# Patient Record
Sex: Female | Born: 1990 | Hispanic: No | Marital: Single | Smoking: Never smoker
Health system: Southern US, Community
[De-identification: ages and names within clinical notes are randomized; demographics above are authoritative.]

## PROBLEM LIST (undated history)

## (undated) DIAGNOSIS — J45909 Unspecified asthma, uncomplicated: Secondary | ICD-10-CM

## (undated) HISTORY — DX: Unspecified asthma, uncomplicated: J45.909

## (undated) HISTORY — PX: WISDOM TOOTH EXTRACTION: SHX21

---

## 2012-03-28 ENCOUNTER — Ambulatory Visit: Payer: Self-pay | Admitting: Family Medicine

## 2012-03-28 VITALS — BP 101/64 | HR 106 | Temp 99.1°F | Resp 16 | Ht 63.5 in | Wt 114.8 lb

## 2012-03-28 DIAGNOSIS — J029 Acute pharyngitis, unspecified: Secondary | ICD-10-CM

## 2012-03-28 LAB — POCT RAPID STREP A (OFFICE): Rapid Strep A Screen: NEGATIVE

## 2012-03-28 MED ORDER — AMOXICILLIN 875 MG PO TABS: 875.0000 mg | ORAL_TABLET | Freq: Two times a day (BID) | ORAL | Status: AC

## 2012-03-28 NOTE — Patient Instructions (Signed)

## 2012-03-28 NOTE — Progress Notes (Signed)
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  She has two days of sore throat, worsening, and associated with some difficulty breathing today.  No GI sx  O:  NAD Throat intensely red in posterior pharynx TM's normal Neck: supple without adenopathy Chest:  No wheezing Heart:  Reg, no murmur or gallop No rash Results for orders placed in visit on 03/28/12  POCT RAPID STREP A (OFFICE)      Component Value Range   Rapid Strep A Screen Negative  Negative    A:  Acute pharyngitis  P:  Amoxicillin 875 bid x 7

## 2013-01-23 ENCOUNTER — Ambulatory Visit: Payer: BC Managed Care – PPO | Admitting: Rehabilitative and Restorative Service Providers"

## 2013-10-03 ENCOUNTER — Ambulatory Visit: Payer: Federal, State, Local not specified - PPO

## 2013-10-03 ENCOUNTER — Ambulatory Visit (INDEPENDENT_AMBULATORY_CARE_PROVIDER_SITE_OTHER): Payer: Federal, State, Local not specified - PPO | Admitting: Family Medicine

## 2013-10-03 VITALS — BP 112/72 | HR 98 | Temp 98.6°F | Resp 18 | Ht 64.0 in | Wt 120.0 lb

## 2013-10-03 DIAGNOSIS — R0602 Shortness of breath: Secondary | ICD-10-CM

## 2013-10-03 DIAGNOSIS — R0789 Other chest pain: Secondary | ICD-10-CM

## 2013-10-03 LAB — POCT CBC
Granulocyte percent: 62.5 %G (ref 37–80)
Hemoglobin: 13.4 g/dL (ref 12.2–16.2)
MCV: 89.9 fL (ref 80–97)
MID (cbc): 0.3 (ref 0–0.9)
Platelet Count, POC: 200 10*3/uL (ref 142–424)
RBC: 4.76 M/uL (ref 4.04–5.48)
WBC: 5.6 10*3/uL (ref 4.6–10.2)

## 2013-10-03 MED ORDER — ALBUTEROL SULFATE HFA 108 (90 BASE) MCG/ACT IN AERS: 2.0000 | INHALATION_SPRAY | RESPIRATORY_TRACT | Status: AC | PRN

## 2013-10-03 MED ORDER — IPRATROPIUM BROMIDE 0.02 % IN SOLN: 0.5000 mg | Freq: Once | RESPIRATORY_TRACT | Status: AC

## 2013-10-03 MED ORDER — ALBUTEROL SULFATE (2.5 MG/3ML) 0.083% IN NEBU: 2.5000 mg | INHALATION_SOLUTION | Freq: Once | RESPIRATORY_TRACT | Status: AC

## 2013-10-03 MED ORDER — GUAIFENESIN ER 1200 MG PO TB12: 1.0000 | ORAL_TABLET | Freq: Two times a day (BID) | ORAL | Status: AC | PRN

## 2013-10-03 NOTE — Progress Notes (Signed)
Subjective:    Patient ID: Hannah Joyce, female    DOB: 13-Mar-1991, 22 y.o.   MRN: [id number]  HPI  This 22 y.o. female presents for evaluation of SOB. Felt fine yesterday and upon going to bed last night. Awoke 4-5 times during the night gasping, couldn't catch her breath. "Like a 400-pound man was sitting on my chest." Still feels a heaviness, and now feels a scratchiness in the throat. Had some dizziness earlier today, and some congestion in her throat. No recent travel or prolonged inactivity. No chest PAIN. No abdominal pain. No alleviating/aggravating factors. No coughing, HA, nausea, vomiting, diarrhea, urinary urgency or frequency, myalgias or arthralgias.    Active Ambulatory Problems    Diagnosis Date Noted  . No Active Ambulatory Problems   Resolved Ambulatory Problems    Diagnosis Date Noted  . No Resolved Ambulatory Problems   Past Medical History  Diagnosis Date  . Asthma     Past Surgical History  Procedure Laterality Date  . Wisdom tooth extraction      No Known Allergies  Prior to Admission medications   Medication Sig Start Date End Date Taking? Authorizing Provider  Norethin-Eth Estradiol-Fe (GENERESS FE PO) Take by mouth.   Yes Historical Provider, MD    History   Social History  . Marital Status: Single    Spouse Name: n/a    Number of Children: 0  . Years of Education: N/A   Occupational History  . Student     [employer]   Social History Main Topics  . Smoking status: Never Smoker   . Smokeless tobacco: Never Used  . Alcohol Use: No  . Drug Use: No  . Sexual Activity: None   Other Topics Concern  . None   Social History Narrative   Lives on campus at [employer] with a roommate.  Her family lives in [place], [place].     family history includes Diabetes in her maternal grandfather; Hypertension in her father and paternal grandmother. indicated that her mother is alive. She indicated that her father is alive. She  indicated that her brother is alive. She indicated that her maternal grandfather is alive. She indicated that her paternal grandmother is alive.    Review of Systems     Objective:   Physical Exam Blood pressure 112/72, pulse 98, temperature 98.6 F (37 C), temperature source Oral, resp. rate 18, height 5\' 4"  (1.626 m), weight 120 lb (54.432 kg), last menstrual period 09/05/2013, SpO2 100.00%. Body mass index is 20.59 kg/(m^2). Well-developed, well nourished BF who is awake, alert and oriented, in NAD. HEENT: /AT, PERRL, EOMI.  Sclera and conjunctiva are clear.  Funduscopic exam is normal bilaterally. EAC are notable for large cerumen bilaterally, L>R, RIGHT TM normal in appearance. LEFT TM obscured by cerumen. Nasal mucosa is pink and moist. OP is clear. Neck: supple, non-tender, no lymphadenopathy, thyromegaly. Heart: RRR, no murmur Lungs: normal effort, CTA Abdomen: normo-active bowel sounds, supple, no mass or organomegaly. Mild suprapubic tenderness on exam. Extremities: no cyanosis, clubbing or edema. Skin: warm and dry without rash. Psychologic: good mood and appropriate affect, normal speech and behavior.   CXR: [facility] reading (PRIMARY) by  Dr. Patsy Lager.  Normal chest.  No evidence of infiltrate or pneumothorax.  EKG: NSR.  No ischemia or dysrhythmias. Reviewed with Dr. Patsy Lager.  Results for orders placed in visit on 10/03/13  POCT CBC      Result Value Range   WBC 5.6  4.6 - 10.2 K/uL  Lymph, poc 1.8  0.6 - 3.4   POC LYMPH PERCENT 32.8  10 - 50 %L   MID (cbc) 0.3  0 - 0.9   POC MID % 4.7  0 - 12 %M   POC Granulocyte 3.5  2 - 6.9   Granulocyte percent 62.5  37 - 80 %G   RBC 4.76  4.04 - 5.48 M/uL   Hemoglobin 13.4  12.2 - 16.2 g/dL   HCT, POC 16.1  09.6 - 47.9 %   MCV 89.9  80 - 97 fL   MCH, POC 28.2  27 - 31.2 pg   MCHC 31.3 (*) 31.8 - 35.4 g/dL   RDW, POC 04.5     Platelet Count, POC 200  142 - 424 K/uL   MPV 10.1  0 - 99.8 fL   Albuterol + Atrovent neb  without change in symptoms.     Assessment & Plan:  SOB (shortness of breath) - Plan: POCT CBC, DG Chest 2 View, D-dimer, quantitative, albuterol (PROVENTIL) (2.5 MG/3ML) 0.083% nebulizer solution 2.5 mg, ipratropium (ATROVENT) nebulizer solution 0.5 mg  Chest tightness or pressure - Plan: EKG 12-Lead, albuterol (PROVENTIL HFA;VENTOLIN HFA) 108 (90 BASE) MCG/ACT inhaler, Guaifenesin (MUCINEX MAXIMUM STRENGTH) 1200 MG TB12  Etiology not identified.  Await D-dimer.  If positive, plan CT chest.  If negative, rest, hydrate and await resolution or development of new symptoms to help identify cause.  Fernande Bras, PA-C Physician Assistant-Certified [facility]

## 2013-10-03 NOTE — Patient Instructions (Signed)
Use the albuterol inhaler if you become short of breath. If it is ineffective, go to the emergency department or call 911. Use the mucinex to thin the mucous you feel in the throat.  I will contact you with the results of the d-dimer test as soon as it is available. If you haven't heard from me by 8 pm this evening, please contact me.

## 2014-06-20 IMAGING — CR DG CHEST 2V
2 series · 2 of 2 positions shown · non-contrast
Comparison: None.

CLINICAL DATA: Shortness of breath, chest pressure

EXAM:
CHEST  2 VIEW

[PA]
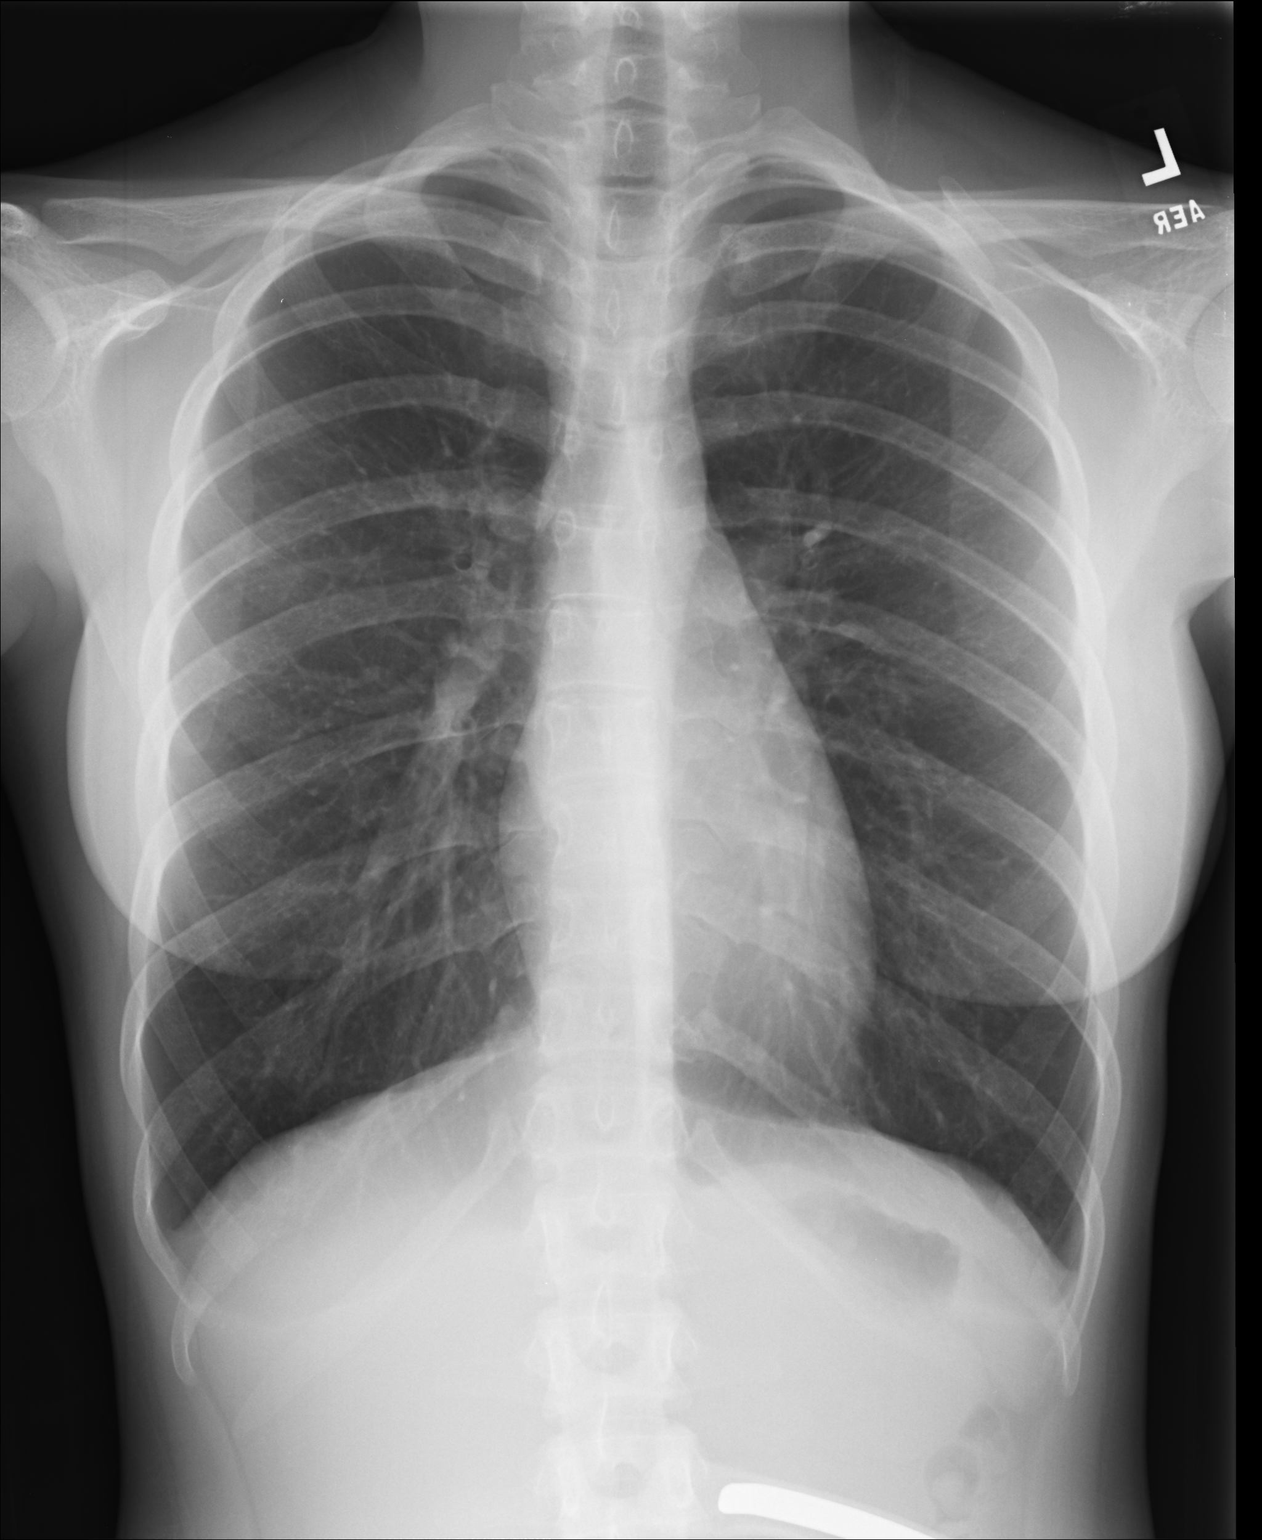

[lateral]
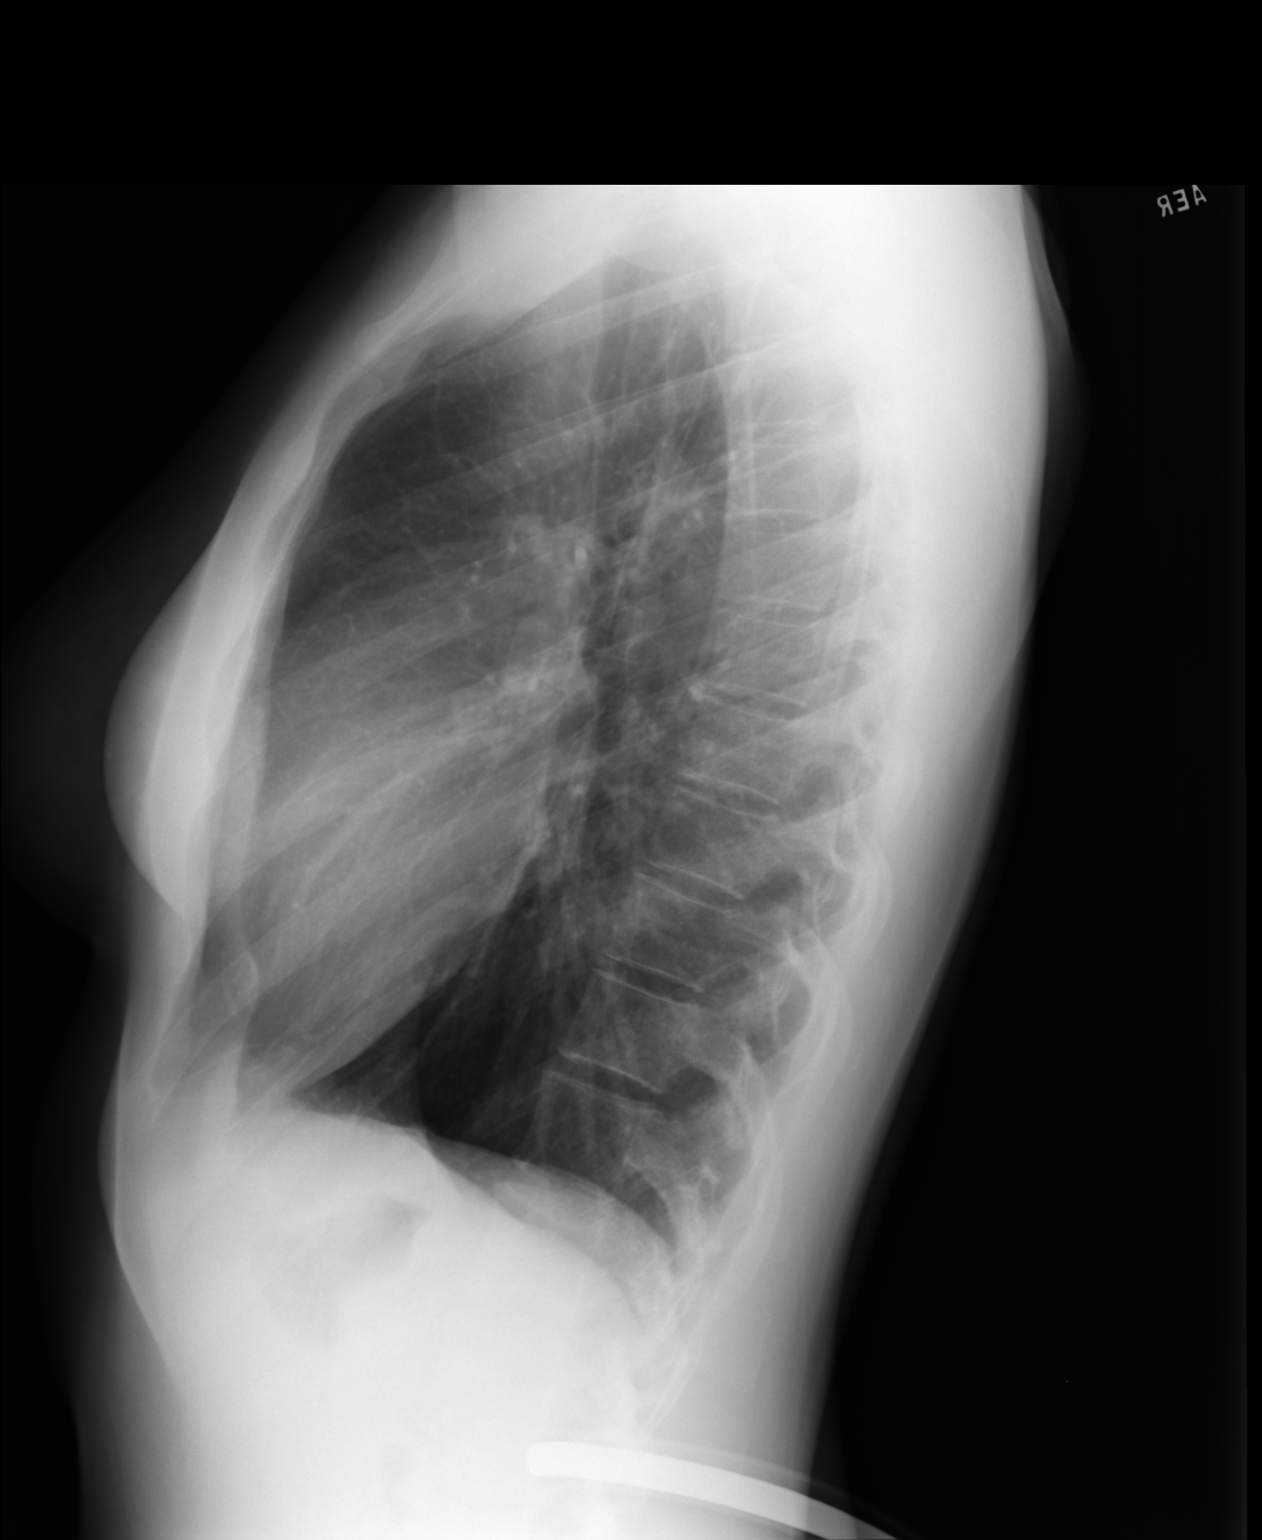

[2 of 2 positions shown; findings below may reference images not displayed]

FINDINGS: The heart size and mediastinal contours are within normal limits.
Both lungs are clear. The visualized skeletal structures are
unremarkable.
IMPRESSION: No active cardiopulmonary disease.

## 2014-10-22 ENCOUNTER — Ambulatory Visit (INDEPENDENT_AMBULATORY_CARE_PROVIDER_SITE_OTHER): Payer: Federal, State, Local not specified - PPO | Admitting: Internal Medicine

## 2014-10-22 VITALS — BP 108/72 | HR 104 | Temp 98.5°F | Resp 18 | Ht 63.5 in | Wt 130.0 lb

## 2014-10-22 DIAGNOSIS — M79644 Pain in right finger(s): Secondary | ICD-10-CM

## 2014-10-22 DIAGNOSIS — R059 Cough, unspecified: Secondary | ICD-10-CM

## 2014-10-22 DIAGNOSIS — M25511 Pain in right shoulder: Secondary | ICD-10-CM

## 2014-10-22 DIAGNOSIS — R197 Diarrhea, unspecified: Secondary | ICD-10-CM

## 2014-10-22 DIAGNOSIS — R300 Dysuria: Secondary | ICD-10-CM

## 2014-10-22 DIAGNOSIS — R05 Cough: Secondary | ICD-10-CM

## 2014-10-22 DIAGNOSIS — J069 Acute upper respiratory infection, unspecified: Secondary | ICD-10-CM

## 2014-10-22 LAB — POCT UA - MICROSCOPIC ONLY
CASTS, UR, LPF, POC: NEGATIVE
Crystals, Ur, HPF, POC: NEGATIVE
MUCUS UA: NEGATIVE
RBC, urine, microscopic: NEGATIVE
Yeast, UA: NEGATIVE

## 2014-10-22 LAB — POCT URINALYSIS DIPSTICK
Bilirubin, UA: NEGATIVE
Glucose, UA: NEGATIVE
Nitrite, UA: NEGATIVE
Protein, UA: NEGATIVE
RBC UA: NEGATIVE
SPEC GRAV UA: 1.01
UROBILINOGEN UA: 0.2
pH, UA: 7

## 2014-10-22 LAB — POCT CBC
Granulocyte percent: 70.4 %G (ref 37–80)
HCT, POC: 42.5 % (ref 37.7–47.9)
HEMOGLOBIN: 13.3 g/dL (ref 12.2–16.2)
Lymph, poc: 2.3 (ref 0.6–3.4)
MCH, POC: 27 pg (ref 27–31.2)
MCHC: 31.3 g/dL — AB (ref 31.8–35.4)
MCV: 86.4 fL (ref 80–97)
MID (cbc): 0.4 (ref 0–0.9)
MPV: 8.2 fL (ref 0–99.8)
POC GRANULOCYTE: 6.5 (ref 2–6.9)
POC LYMPH PERCENT: 24.8 %L (ref 10–50)
POC MID %: 4.8 % (ref 0–12)
Platelet Count, POC: 183 10*3/uL (ref 142–424)
RBC: 4.91 M/uL (ref 4.04–5.48)
RDW, POC: 14 %
WBC: 9.3 10*3/uL (ref 4.6–10.2)

## 2014-10-22 MED ORDER — ONDANSETRON HCL 4 MG PO TABS: 4.0000 mg | ORAL_TABLET | Freq: Three times a day (TID) | ORAL | Status: AC | PRN

## 2014-10-22 MED ORDER — HYDROCODONE-HOMATROPINE 5-1.5 MG/5ML PO SYRP: 5.0000 mL | ORAL_SOLUTION | Freq: Four times a day (QID) | ORAL | Status: AC | PRN

## 2014-10-22 NOTE — Patient Instructions (Addendum)
Recheck 2-3 weeks if not well  Sudafed for congestion

## 2014-10-22 NOTE — Progress Notes (Signed)
Subjective:  This chart was scribed for Hannah Siaobert Jojuan Champney, MD by Carl Bestelina Holson, Medical Scribe. This patient was seen in Room 1 and the patient's care was started at 3:43 PM.   Patient ID: Hannah Joyce, female    DOB: [birth year], 23 y.o.   MRN: [id number]  HPI HPI Comments: Hannah Joyce is a 23 y.o. female who presents to the [facility] complaining constant congestion and cough of that 3-4 days ago.  Her symptoms started a week and a half ago with vomiting and diarrhea after she ate Chipotle.  She only vomited for 2 days before it resolved.  She is no longer experiencing diarrhea and states that there was no hematochezia when she did have it.   She endorses sore throat, abdominal pain, nausea, chills, dysuria, decreased appetite, decreased energy, and popping in her ears as associated symptoms over last 3d.  She denies fever and urinary frequency as associated symptoms.   She had a history of asthma when she was 9.    Complaining of pain in the right thumb, right elbow, and right shoulder for the past 3-4 weeks.  Hurt when texting or working in [employer].  No known injury.    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    Past Medical History  Diagnosis Date  . Asthma    Past Surgical History  Procedure Laterality Date  . Wisdom tooth extraction     Family History  Problem Relation Age of Onset  . Hypertension Father   . Diabetes Maternal Grandfather   . Hypertension Paternal Grandmother    History   Social History  . Marital Status: Single    Spouse Name: n/a    Number of Children: 0  . Years of Education: N/A   Occupational History  . Student     [employer]   Social History Main Topics  . Smoking status: Never Smoker   . Smokeless tobacco: Never Used  . Alcohol Use: No  . Drug Use: No  . Sexual Activity: Not on file   Other Topics Concern  . Not on file   Social History Narrative   Lives on campus at [employer] with a roommate.   [place] in [place], [place].    No Known Allergies    Review of Systems  Constitutional: Positive for chills, activity change and appetite change. Negative for fever.  HENT: Positive for congestion and sore throat.   Respiratory: Positive for cough.   Gastrointestinal: Positive for nausea and abdominal pain. Negative for vomiting and diarrhea.  Genitourinary: Positive for dysuria. Negative for frequency.    Objective:  Physical Exam  Constitutional: She is oriented to person, place, and time. She appears well-developed and well-nourished. No distress.  HENT:  Head: Normocephalic and atraumatic.  Right Ear: Tympanic membrane normal.  Left Ear: Tympanic membrane normal.  Nose: Rhinorrhea (clear) present.  Mouth/Throat: No oropharyngeal exudate.  Slightly injected tonsils.  No AC or PC nodes.  Eyes: Conjunctivae and EOM are normal. Pupils are equal, round, and reactive to light.  Neck: Neck supple.  Cardiovascular: Normal rate.   Pulmonary/Chest: Effort normal and breath sounds normal. No respiratory distress. She has no wheezes. She has no rales.  She coughs with deep inspiration but no wheezing.  Abdominal: Soft. Bowel sounds are normal. There is no tenderness.  Musculoskeletal: She exhibits tenderness.  She has tenderness at the base of the thumb and pain with stretching the extensor tendon.  The elbow exam is normal but there is tenderness over the deltoid on the right and pain with resisted abduction, anterior elevation, and pain with external rotation.   Neurological: She is alert and oriented to person, place, and time. No cranial nerve deficit.  Psychiatric: She has a normal mood and affect. Her behavior is normal.  Nursing note and vitals reviewed.   BP 108/72 mmHg  Pulse 104  Temp(Src) 98.5 F (36.9 C) (Oral)  Resp 18  Ht 5' 3.5" (1.613 m)  Wt 130 lb (58.968 kg)  BMI 22.66 kg/m2  SpO2 100%  LMP 10/08/2014 Assessment & Plan:  URI (upper respiratory  infection)  Diarrhea -f/u if not resolved  Dysuria -u/a wnl  Pain of right thumb--?overuse tendonitis//rom exer---thumb spica spl  F/u 3 wk  Pain in joint, shoulder region, right--tendonitis rom exer f/u 3 wk  Cough-2 uri/.viral   Meds ordered this encounter  Medications  . norgestimate-ethinyl estradiol (SPRINTEC 28) 0.25-35 MG-MCG tablet    Sig: Take 1 tablet by mouth daily.  Marland Kitchen. HYDROcodone-homatropine (HYCODAN) 5-1.5 MG/5ML syrup    Sig: Take 5 mLs by mouth every 6 (six) hours as needed.    Dispense:  120 mL    Refill:  0  . ondansetron (ZOFRAN) 4 MG tablet    Sig: Take 1 tablet (4 mg total) by mouth every 8 (eight) hours as needed for nausea or vomiting.    Dispense:  10 tablet    Refill:  0    I personally performed the services described in this documentation, which was scribed in my presence. The recorded information has been reviewed and is accurate.
